# Patient Record
Sex: Male | Born: 1995 | Race: Black or African American | Hispanic: No | Marital: Single | Smoking: Never smoker
Health system: Southern US, Community
[De-identification: ages and names within clinical notes are randomized; demographics above are authoritative.]

## PROBLEM LIST (undated history)

## (undated) HISTORY — PX: KNEE SURGERY: SHX244

---

## 2014-06-11 ENCOUNTER — Emergency Department (INDEPENDENT_AMBULATORY_CARE_PROVIDER_SITE_OTHER): Payer: Managed Care, Other (non HMO)

## 2014-06-11 ENCOUNTER — Encounter (HOSPITAL_COMMUNITY): Payer: Self-pay | Admitting: Emergency Medicine

## 2014-06-11 ENCOUNTER — Emergency Department (INDEPENDENT_AMBULATORY_CARE_PROVIDER_SITE_OTHER)
Admission: EM | Admit: 2014-06-11 | Discharge: 2014-06-11 | Disposition: A | Discharge status: Home / Self Care | Payer: Managed Care, Other (non HMO) | Source: Home / Self Care | Attending: Family Medicine | Admitting: Family Medicine

## 2014-06-11 DIAGNOSIS — S60221A Contusion of right hand, initial encounter: Secondary | ICD-10-CM

## 2014-06-11 NOTE — Discharge Instructions (Signed)
Ice and advil as needed for soreness.
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
## 2014-09-03 ENCOUNTER — Encounter (HOSPITAL_COMMUNITY): Payer: Self-pay

## 2014-09-03 ENCOUNTER — Emergency Department (HOSPITAL_COMMUNITY)
Admission: EM | Admit: 2014-09-03 | Discharge: 2014-09-03 | Disposition: A | Discharge status: Home / Self Care | Payer: Managed Care, Other (non HMO) | Attending: Emergency Medicine | Admitting: Emergency Medicine

## 2014-09-03 DIAGNOSIS — Y9389 Activity, other specified: Secondary | ICD-10-CM | POA: Diagnosis not present

## 2014-09-03 DIAGNOSIS — X58XXXA Exposure to other specified factors, initial encounter: Secondary | ICD-10-CM | POA: Diagnosis not present

## 2014-09-03 DIAGNOSIS — S29092A Other injury of muscle and tendon of back wall of thorax, initial encounter: Secondary | ICD-10-CM | POA: Diagnosis present

## 2014-09-03 DIAGNOSIS — S29012A Strain of muscle and tendon of back wall of thorax, initial encounter: Secondary | ICD-10-CM | POA: Insufficient documentation

## 2014-09-03 DIAGNOSIS — Y9289 Other specified places as the place of occurrence of the external cause: Secondary | ICD-10-CM | POA: Insufficient documentation

## 2014-09-03 DIAGNOSIS — T148XXA Other injury of unspecified body region, initial encounter: Secondary | ICD-10-CM

## 2014-09-03 DIAGNOSIS — M62830 Muscle spasm of back: Secondary | ICD-10-CM

## 2014-09-03 DIAGNOSIS — Y990 Civilian activity done for income or pay: Secondary | ICD-10-CM | POA: Insufficient documentation

## 2014-09-03 MED ORDER — IBUPROFEN 800 MG PO TABS: 800.0000 mg | ORAL_TABLET | Freq: Three times a day (TID) | ORAL | Status: AC

## 2014-09-03 MED ORDER — CYCLOBENZAPRINE HCL 5 MG PO TABS: 5.0000 mg | ORAL_TABLET | Freq: Three times a day (TID) | ORAL | Status: AC | PRN

## 2014-09-03 NOTE — ED Provider Notes (Signed)
CSN: [id number]     Arrival date & time 09/03/14  0944 History   First MD Initiated Contact with Patient 09/03/14 1003     Chief Complaint  Patient presents with  . Shoulder Pain     (Consider location/radiation/quality/duration/timing/severity/associated sxs/prior Treatment) HPI Comments: Pt comes in with c/o upper back pain that has been going on for 6 days. He states that he hasn't had any definite injury but he work for ups lifting boxes. He denies numbness or weakness. He has tried massage and otc medications without relief.no previous injury.  The history is provided by the patient. No language interpreter was used.    History reviewed. No pertinent past medical history. Past Surgical History  Procedure Laterality Date  . Knee surgery     No family history on file. History  Substance Use Topics  . Smoking status: Never Smoker   . Smokeless tobacco: Not on file  . Alcohol Use: Yes     Comment: socially     Review of Systems  All other systems reviewed and are negative.     Allergies  Review of patient's allergies indicates no known allergies.  Home Medications   Prior to Admission medications   Medication Sig Start Date End Date Taking? Authorizing Provider  ibuprofen (ADVIL,MOTRIN) 200 MG tablet Take 200 mg by mouth every 6 (six) hours as needed.   Yes Historical Provider, MD  OVER THE COUNTER MEDICATION Apply 1 Units topically as needed (Tiger Balm for muscle pain).   Yes Historical Provider, MD  tetrahydrozoline (VISINE) 0.05 % ophthalmic solution Place 1 drop into both eyes as needed.   Yes Historical Provider, MD  cyclobenzaprine (FLEXERIL) 5 MG tablet Take 1 tablet (5 mg total) by mouth 3 (three) times daily as needed for muscle spasms. 09/03/14   Teressa LowerVrinda Worth Kober, NP  ibuprofen (ADVIL,MOTRIN) 800 MG tablet Take 1 tablet (800 mg total) by mouth 3 (three) times daily. 09/03/14   Teressa LowerVrinda Talonda Artist, NP   BP 134/86 mmHg  Pulse 77  Temp(Src) 97.7 F (36.5 C)  (Oral)  Resp 18  SpO2 100% Physical Exam  Constitutional: He appears well-developed and well-nourished.  Cardiovascular: Normal rate and regular rhythm.   Pulmonary/Chest: Effort normal and breath sounds normal.  Musculoskeletal:  Bilateral thoracic paraspinal tenderness. Full rom and strength of bilateral upper extremity.pulses intact  Neurological: He is alert.  Skin: Skin is warm and dry.  Psychiatric: He has a normal mood and affect.  Nursing note and vitals reviewed.   ED Course  Procedures (including critical care time) Labs Review Labs Reviewed - No data to display  Imaging Review No results found.   EKG Interpretation None      MDM   Final diagnoses:  Muscle spasm of back  Muscle strain    Likely strain/spasm. No red flag symptoms. Pt is okay to follow up with ortho for continued symptoms    Teressa LowerVrinda Mysty Kielty, NP 09/03/14 1014  Raeford RazorStephen Kohut, MD 09/07/14 1331

## 2014-09-03 NOTE — Discharge Instructions (Signed)

## 2014-09-03 NOTE — ED Notes (Signed)
Pt presents with c/o right shoulder pain for approx 6 days. Pt reports he lifts heavy things at work and has felt some discomfort in that shoulder area. No obvious deformity noted, ambulatory to room.

## 2015-01-25 ENCOUNTER — Emergency Department (HOSPITAL_COMMUNITY)
Admission: EM | Admit: 2015-01-25 | Discharge: 2015-01-25 | Discharge status: Absent without leave | Payer: Managed Care, Other (non HMO) | Source: Home / Self Care
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
